# Patient Record
Sex: Male | Born: 1970 | Race: White | Hispanic: No | Marital: Married | Smoking: Never smoker
Health system: Southern US, Community
[De-identification: ages and names within clinical notes are randomized; demographics above are authoritative.]

## PROBLEM LIST (undated history)

## (undated) DIAGNOSIS — R7989 Other specified abnormal findings of blood chemistry: Secondary | ICD-10-CM

## (undated) DIAGNOSIS — I4891 Unspecified atrial fibrillation: Secondary | ICD-10-CM

## (undated) DIAGNOSIS — I10 Essential (primary) hypertension: Secondary | ICD-10-CM

## (undated) HISTORY — DX: Other specified abnormal findings of blood chemistry: R79.89

## (undated) HISTORY — DX: Essential (primary) hypertension: I10

## (undated) HISTORY — DX: Unspecified atrial fibrillation: I48.91

---

## 2007-01-02 ENCOUNTER — Emergency Department (HOSPITAL_COMMUNITY): Admission: EM | Admit: 2007-01-02 | Discharge: 2007-01-02 | Payer: Self-pay | Admitting: Emergency Medicine

## 2007-12-27 IMAGING — CR DG FINGER INDEX 2+V*L*
3 series · 3 of 3 positions shown · non-contrast
Comparison: none

CLINICAL DATA: Finger injury.  
 LEFT SECOND FINGER ? 3 VIEW:

[x finger pa left]
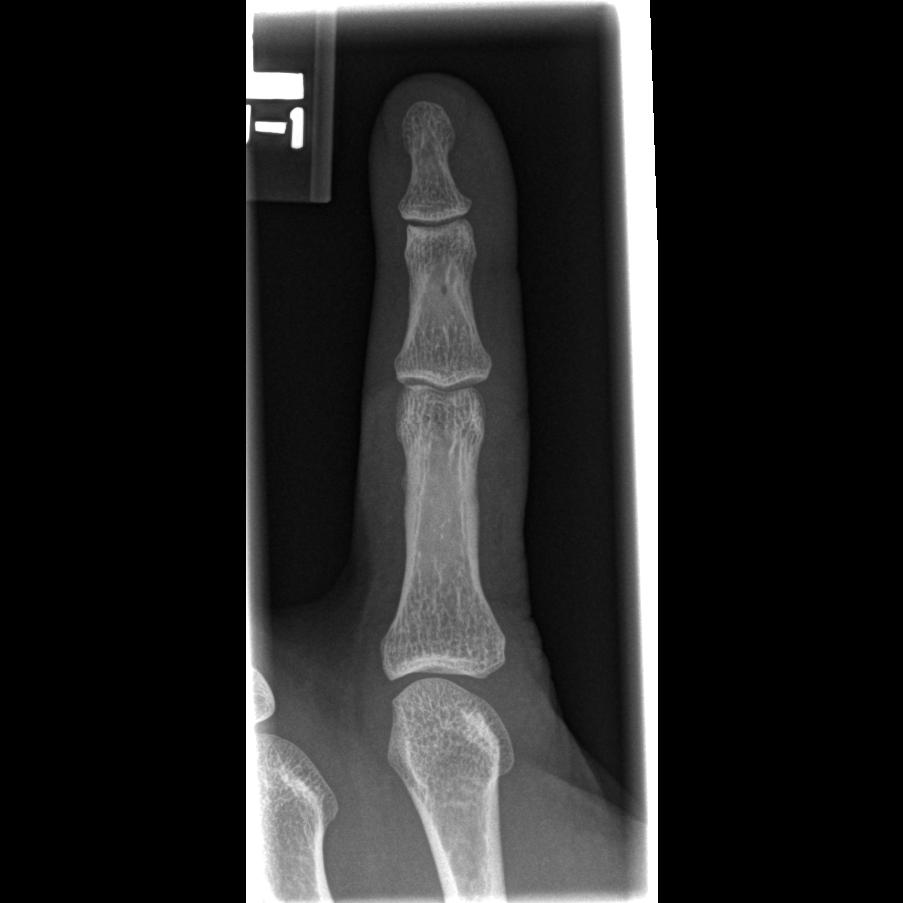

[x finger obl. left]
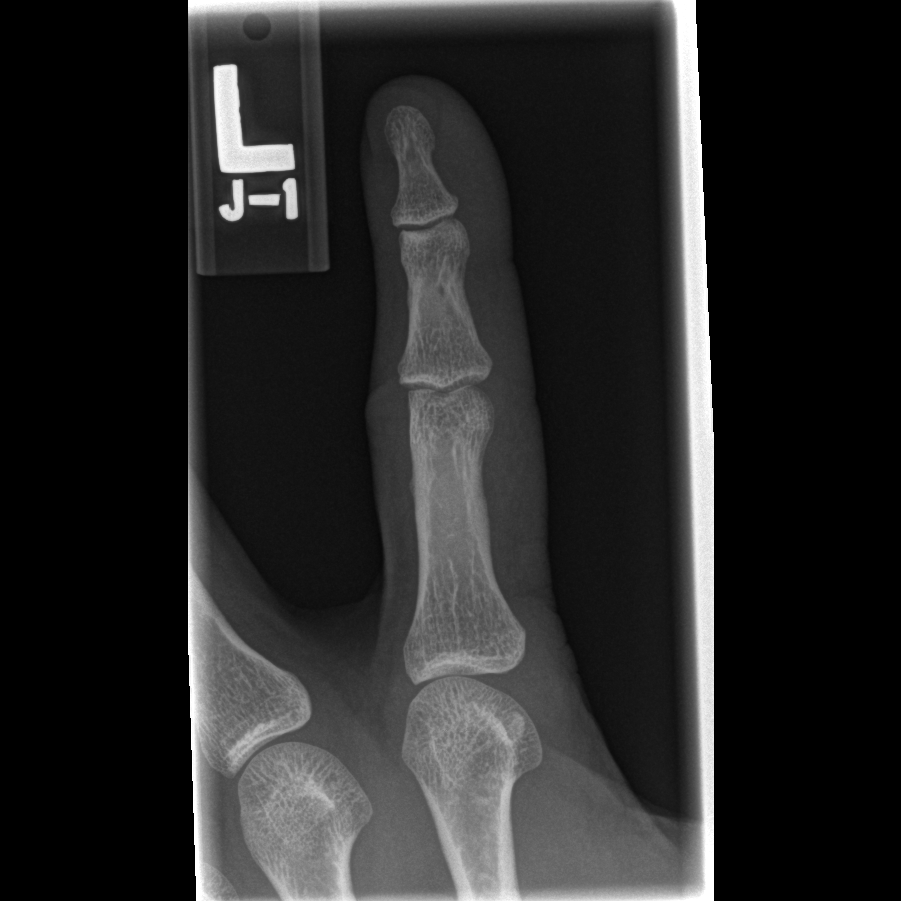

[x finger lateral left]
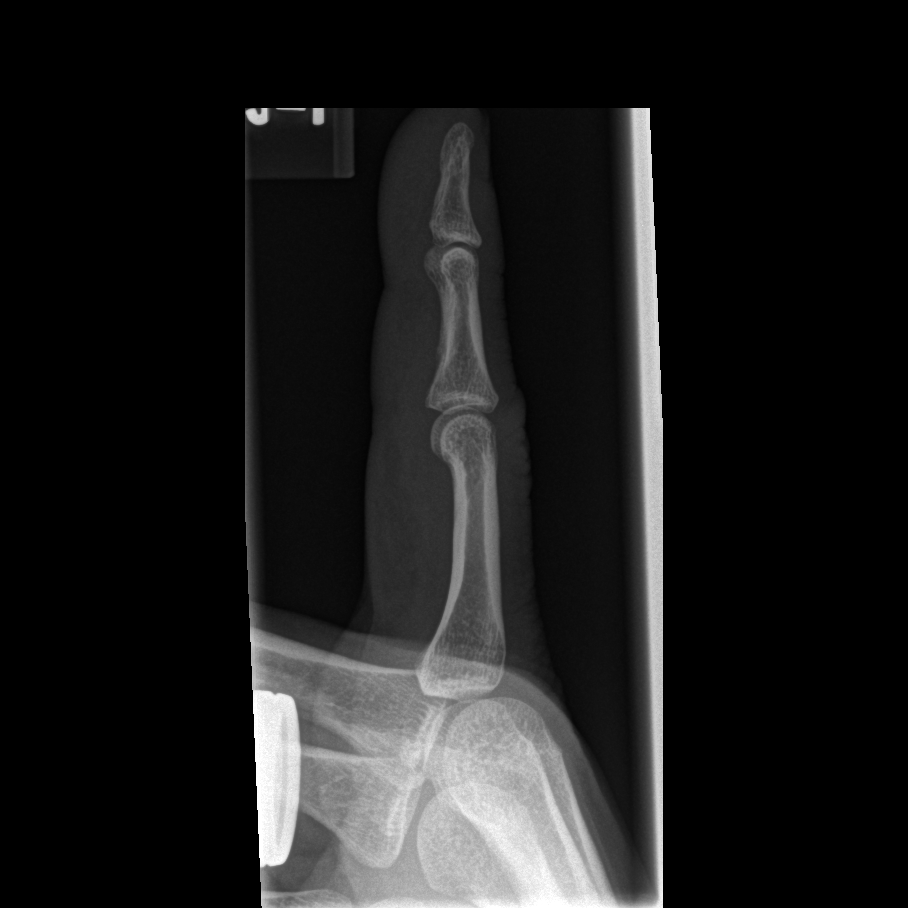

[3 of 3 positions shown; findings below may reference images not displayed]

FINDINGS: There is no evidence of fracture or dislocation.  There is no evidence of arthropathy or other focal bone abnormality.  Soft tissues are unremarkable.
IMPRESSION: Negative.

## 2008-01-14 ENCOUNTER — Inpatient Hospital Stay (HOSPITAL_COMMUNITY): Admission: EM | Admit: 2008-01-14 | Discharge: 2008-01-16 | Payer: Self-pay | Admitting: Emergency Medicine

## 2008-01-15 ENCOUNTER — Encounter (INDEPENDENT_AMBULATORY_CARE_PROVIDER_SITE_OTHER): Payer: Self-pay | Admitting: Cardiology

## 2008-01-15 ENCOUNTER — Other Ambulatory Visit: Payer: Self-pay | Admitting: Cardiology

## 2008-01-15 HISTORY — PX: TEE WITHOUT CARDIOVERSION: SHX5443

## 2008-01-16 ENCOUNTER — Other Ambulatory Visit: Payer: Self-pay | Admitting: Cardiology

## 2008-02-14 DIAGNOSIS — I4891 Unspecified atrial fibrillation: Secondary | ICD-10-CM

## 2008-02-14 HISTORY — DX: Unspecified atrial fibrillation: I48.91

## 2008-05-22 ENCOUNTER — Emergency Department (HOSPITAL_BASED_OUTPATIENT_CLINIC_OR_DEPARTMENT_OTHER): Admission: EM | Admit: 2008-05-22 | Discharge: 2008-05-23 | Payer: Self-pay | Admitting: Emergency Medicine

## 2009-01-07 IMAGING — CR DG CHEST 1V PORT
1 series · 1 of 1 positions shown · non-contrast
Comparison: None

CLINICAL DATA: Chest pain, heart racing

PORTABLE CHEST - 1 VIEW

[view not recorded]
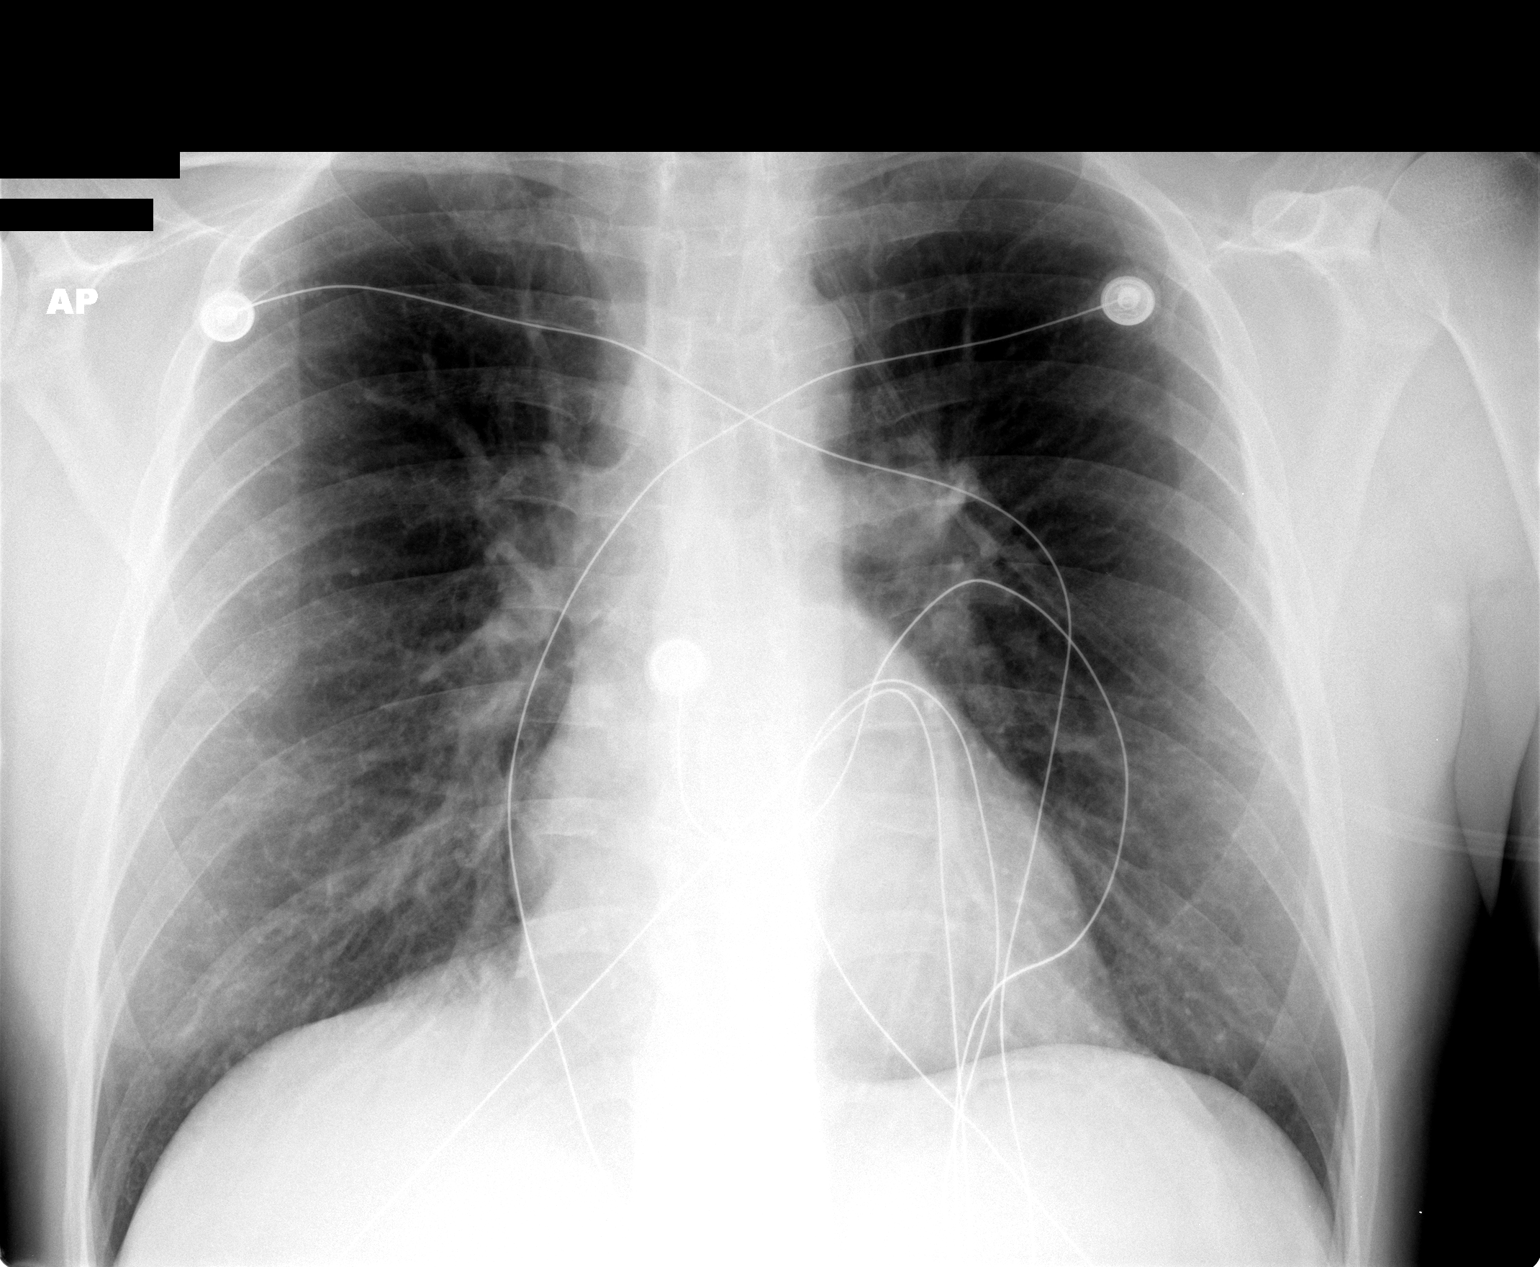

[1 of 1 positions shown; findings below may reference images not displayed]

FINDINGS: Heart and mediastinal contours are within normal limits.
No focal opacities or effusions.  No acute bony abnormality.
IMPRESSION: No active disease.

## 2010-12-01 NOTE — Discharge Summary (Signed)
NAMECOLLIS, THEDE NO.:  [id number]   MEDICAL RECORD NO.:  [id number]           PATIENT TYPE:   LOCATION:                                 FACILITY:   PHYSICIAN:  Thereasa Solo. Little, M.D. DATE OF BIRTH:  Nov 23, 1970   DATE OF ADMISSION:  DATE OF DISCHARGE:                               DISCHARGE SUMMARY   Douglas [name] is a 40 year old white married male patient who came to  [facility] ER because of complaints of chest discomfort with deep  inspiration and having had palpitations, felt like his heart was beating  out of his chest.  He was found to be in atrial fib with rapid  ventricular response.  He was transferred to [facility] with the  thoughts that he may need TE cardioversion.  He was placed on IV  Cardizem drip and on IV heparin.  He was seen by Dr. Clarene Duke on January 15, 2008.  He had converted to sinus rhythm.  The TE cardioversion did not  need to be done.  CK-MBs and troponins were all negative.  He did have  some elevated CKs.  His total cholesterol was 155, LDL was 80, HDL was  31, and triglycerides were 218.  He was considered stable for discharge  on January 16, 2008, and he was put on p.o. Cardizem.  Dr. Clarene Duke felt that  he had an isolated episode of PAF and then we would need to re-evaluate  if he needed Coumadin if he had any more episodes.  He also had some  elevated blood pressure which were all be taken care of by his Cardizem.  He did undergo a 2-D echocardiogram that showed mild LVH and normal  systolic function.  His LA and RA were normal.  He had mild MR and trace  TR.   DISCHARGE MEDICATIONS:  1. Diltiazem ER 180 mg a day.  2. Aspirin 325 mg every day.  3. AndroGel topical daily as previously.   He will follow up Dr. Clarene Duke on February 09, 2008, at 11:20.   LABORATORY DATA:  Hemoglobin 14.7, hematocrit 42.8, WBCs 5.8, and  platelets 178,000.  Sodium 140, potassium 4.1, chloride 109, CO2 25, BUN  11, creatinine 0.73, and glucose 99.  CK-MB  1.  1. 231/3.2, troponin of 0.03.  2. 898/2.3, troponin of 0.01.  3. 652/2.4, troponin of 0.01.   Total cholesterol was 155, LDL was 80, HDL was 31, and triglycerides  were 218.  D-dimer was less than 0.22.  TSH was 1.530.  Chest x-ray  showed no active disease.   DISCHARGE DIAGNOSES:  1. Paroxysmal atrial fibrillation with rapid ventricular response with      symptoms of some chest discomfort with deep inspiration while his      heart rate was elevated.  2. Hypertension.  3. History of nephrolithiasis.  d      Lezlie Octave, N.P.    ______________________________  Thereasa Solo Little, M.D.    BB/MEDQ  D:  01/16/2008  T:  01/17/2008  Job:  [id number]   cc:   Eston Esters

## 2011-04-15 LAB — CBC
HCT: 42.8
MCHC: 33.8
MCHC: 34.4
MCV: 89.6
Platelets: 178
Platelets: 192
RBC: 4.78
RDW: 12.6
WBC: 5.8

## 2011-04-15 LAB — HEPARIN LEVEL (UNFRACTIONATED)
Heparin Unfractionated: 0.45
Heparin Unfractionated: 1.2 — ABNORMAL HIGH

## 2011-04-15 LAB — MAGNESIUM: Magnesium: 2.3

## 2011-04-15 LAB — BASIC METABOLIC PANEL
BUN: 10
BUN: 11
CO2: 25
Chloride: 106
Creatinine, Ser: 0.78
GFR calc Af Amer: >60
GFR calc non Af Amer: >60
GFR calc non Af Amer: >60
Potassium: 4.1
Sodium: 140

## 2011-04-15 LAB — POCT CARDIAC MARKERS
Myoglobin, poc: 154
Operator id: 261601
Troponin i, poc: 0.09 — ABNORMAL HIGH

## 2011-04-15 LAB — CARDIAC PANEL(CRET KIN+CKTOT+MB+TROPI)
CK, MB: 2.3
CK, MB: 2.4
Relative Index: 0.3
Relative Index: 0.4
Troponin I: 0.01
Troponin I: 0.01

## 2011-04-15 LAB — CK TOTAL AND CKMB (NOT AT ARMC): Total CK: 1231 — ABNORMAL HIGH

## 2011-04-15 LAB — LIPID PANEL: Total CHOL/HDL Ratio: 5

## 2011-04-15 LAB — PROTIME-INR: INR: 0.9

## 2011-04-15 LAB — APTT: aPTT: 30

## 2011-04-20 LAB — POCT CARDIAC MARKERS
CKMB, poc: <1 — ABNORMAL LOW
Myoglobin, poc: 51.4
Troponin i, poc: <0.05
Troponin i, poc: <0.05

## 2011-04-20 LAB — CBC
HCT: 45.2
Hemoglobin: 15.7
MCV: 88.1
Platelets: 213
RDW: 11.4 — ABNORMAL LOW

## 2011-04-20 LAB — COMPREHENSIVE METABOLIC PANEL
Alkaline Phosphatase: 78
BUN: 16
Creatinine, Ser: 0.9
Glucose, Bld: 82
Potassium: 4.2
Total Bilirubin: 0.6
Total Protein: 7.5

## 2011-04-20 LAB — DIFFERENTIAL
Basophils Absolute: 0
Eosinophils Absolute: 0.1
Lymphs Abs: 2.1
Monocytes Absolute: 0.5
Monocytes Relative: 7

## 2011-04-20 LAB — LIPASE, BLOOD: Lipase: 136

## 2012-09-16 ENCOUNTER — Encounter: Payer: Self-pay | Admitting: Pharmacist Clinician (PhC)/ Clinical Pharmacy Specialist

## 2012-10-16 ENCOUNTER — Encounter: Payer: Self-pay | Admitting: Internal Medicine

## 2013-03-06 ENCOUNTER — Other Ambulatory Visit: Payer: Self-pay | Admitting: Internal Medicine

## 2013-03-07 NOTE — Telephone Encounter (Signed)
Rx was sent to pharmacy electronically. 

## 2013-05-31 ENCOUNTER — Other Ambulatory Visit: Payer: Self-pay | Admitting: Internal Medicine

## 2013-06-06 ENCOUNTER — Encounter: Payer: Self-pay | Admitting: Internal Medicine

## 2013-06-06 ENCOUNTER — Ambulatory Visit (INDEPENDENT_AMBULATORY_CARE_PROVIDER_SITE_OTHER): Payer: BC Managed Care – PPO | Admitting: Internal Medicine

## 2013-06-06 VITALS — BP 118/70 | HR 65 | Ht 71.0 in | Wt 183.6 lb

## 2013-06-06 DIAGNOSIS — I480 Paroxysmal atrial fibrillation: Secondary | ICD-10-CM

## 2013-06-06 DIAGNOSIS — I4891 Unspecified atrial fibrillation: Secondary | ICD-10-CM

## 2013-06-06 DIAGNOSIS — I10 Essential (primary) hypertension: Secondary | ICD-10-CM

## 2013-06-06 NOTE — Patient Instructions (Signed)
Your physician wants you to follow-up in: 1 year. You will receive a reminder letter in the mail two months in advance. If you don't receive a letter, please call our office to schedule the follow-up appointment.  

## 2013-06-06 NOTE — Progress Notes (Signed)
OFFICE NOTE  Chief Complaint:  Routine follow-up  Primary Care Physician: No PCP Per Patient  HPI:  Douglas [name] is a 42 yo male patient, previously followed by Dr. Clarene [name], but new to me. He has a history of paroxysmal atrial fibrillation that date back to 2009. He was switched from Coumadin to Xarelto. He has seen no bleeding from the Xarelto, and the bruising that he was having fairly regularly with the Coumadin has resolved. An echocardiogram from 2009 showed his left atrial size to be normal with mild MR and trivial TR. His LV systolic function was normal. His CHADS2VASC score is 1 and he has been maintained on Xarelto because of this. He reports that he rarely has palpitations now.  It could never be determined what the cause was.   PMHx:  Past Medical History  Diagnosis Date  . Atrial fibrillation 02/14/2008    R/S MV - EF 61%; normal perfusion w/o evidence of infarct or ischemia; EKG negative for ischemia  . Low testosterone   . Hypertension     Past Surgical History  Procedure Laterality Date  . Tee without cardioversion  01/15/2008    EF 60%; LV wall thickness mildly increased; mild mitral valve regurgitation    FAMHx:  Family History  Problem Relation Age of Onset  . ALS Father     SOCHx:   reports that he has never smoked. He has never used smokeless tobacco. He reports that he drinks about 1.0 ounces of alcohol per week. He reports that he does not use illicit drugs.  ALLERGIES:  No Known Allergies  ROS: A comprehensive review of systems was negative except for: Cardiovascular: positive for palpitations  HOME MEDS: Current Outpatient Prescriptions  Medication Sig Dispense Refill  . diltiazem (TIAZAC) 360 MG 24 hr capsule Take 360 mg by mouth daily.      . Multiple Vitamins-Minerals (MULTIVITAMIN WITH MINERALS) tablet Take 1 tablet by mouth daily.      . Rivaroxaban (XARELTO) 20 MG TABS tablet Take 1 tablet (20 mg total) by mouth daily with supper.  90  tablet  0  . sildenafil (VIAGRA) 50 MG tablet Take 50 mg by mouth daily as needed for erectile dysfunction.       No current facility-administered medications for this visit.    LABS/IMAGING: No results found for this or any previous visit (from the past 48 hour(s)). No results found.  VITALS: BP 118/70  Pulse 65  Ht 5\' 11"  (1.803 m)  Wt 183 lb 9.6 oz (83.28 kg)  BMI 25.62 kg/m2  EXAM: General appearance: alert and no distress Neck: no carotid bruit and no JVD Lungs: clear to auscultation bilaterally Heart: regular rate and rhythm, S1, S2 normal, no murmur, click, rub or gallop Abdomen: soft, non-tender; bowel sounds normal; no masses,  no organomegaly Extremities: extremities normal, atraumatic, no cyanosis or edema Pulses: 2+ and symmetric Skin: Skin color, texture, turgor normal. No rashes or lesions Neurologic: Grossly normal Psych: Mood, affect norma;  EKG: Normal sinus at 65  ASSESSMENT: 1. Paroxysmal A. fib-no recurrence in several years 2. Chronic anticoagulation on Xarelto 3. Hypertension-controlled  PLAN: 1.   Douglas [name] has not had recent recurrence of his atrial fibrillation. He remains on Xarelto with a low CHADS2VASC score of 1.  He is in need of refills of his medicine today. He also has hypertension and mild LVH but his blood pressure has been well-controlled on his current medicines. He tells me that he is interested  in moving back to [place], [place] in the near future. I'm happy to see him annually or sooner as needed and can assist in transferring his care to [place] if need be.  Chrystie Nose, MD, [facility] Attending Cardiologist [facility]  Douglas [name] 06/06/2013, 4:48 PM

## 2013-07-10 ENCOUNTER — Other Ambulatory Visit: Payer: Self-pay | Admitting: *Deleted

## 2013-07-10 MED ORDER — DILTIAZEM HCL ER COATED BEADS 180 MG PO CP24: 360.0000 mg | ORAL_CAPSULE | Freq: Every day | ORAL | Status: DC

## 2013-07-10 NOTE — Telephone Encounter (Signed)
Rx was sent to pharmacy electronically. 

## 2014-02-26 ENCOUNTER — Other Ambulatory Visit: Payer: Self-pay | Admitting: Internal Medicine

## 2014-02-27 ENCOUNTER — Telehealth: Payer: Self-pay | Admitting: Pharmacist Clinician (PhC)/ Clinical Pharmacy Specialist

## 2014-02-27 NOTE — Telephone Encounter (Signed)
Pt needed PA faxed to [facility] for Xarelto prescription.  Found request, signed and faxed to [facility].  Pt aware

## 2014-03-05 ENCOUNTER — Telehealth: Payer: Self-pay | Admitting: *Deleted

## 2014-03-05 NOTE — Telephone Encounter (Signed)
Xarelto 20mg  tablets approved 02/02/14 until 03/04/15

## 2014-05-08 ENCOUNTER — Telehealth: Payer: Self-pay | Admitting: Internal Medicine

## 2014-05-08 NOTE — Telephone Encounter (Signed)
Letter composed and faxed to number provided.  Patient notified.

## 2014-05-08 NOTE — Telephone Encounter (Signed)
Pt says he need a release that he can  do exercise at work. They have a gym  at work where they can exercise.

## 2014-05-08 NOTE — Telephone Encounter (Signed)
Patient needs letter faxed to [place] ([place] in [place] stating it is OK to exercise since he takes cardiac medications.   Fax [phone]  Will route to DOD since Dr. Rennis GoldenHilty is not available.

## 2014-05-08 NOTE — Telephone Encounter (Signed)
Forward to Dr Peter MiniumHILTY/JENNA ELKINS RN

## 2014-05-14 ENCOUNTER — Other Ambulatory Visit: Payer: Self-pay | Admitting: Internal Medicine

## 2014-05-14 NOTE — Telephone Encounter (Signed)
Rx was sent to pharmacy electronically. 

## 2014-07-21 ENCOUNTER — Other Ambulatory Visit: Payer: Self-pay | Admitting: Internal Medicine

## 2014-07-24 NOTE — Telephone Encounter (Signed)
Rx(s) sent to pharmacy electronically.  

## 2015-03-10 ENCOUNTER — Other Ambulatory Visit: Payer: Self-pay | Admitting: Internal Medicine

## 2015-03-10 NOTE — Telephone Encounter (Signed)
REFILL
